# Patient Record
Sex: Male | Born: 2005 | Hispanic: Yes | Marital: Single | Smoking: Never smoker
Health system: Southern US, Community
[De-identification: ages and names within clinical notes are randomized; demographics above are authoritative.]

---
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
## 2023-06-09 ENCOUNTER — Ambulatory Visit
Admission: EM | Admit: 2023-06-09 | Discharge: 2023-06-09 | Disposition: A | Discharge status: Home / Self Care | Payer: Medicaid Other | Attending: Emergency Medicine | Admitting: Emergency Medicine

## 2023-06-09 DIAGNOSIS — J029 Acute pharyngitis, unspecified: Secondary | ICD-10-CM | POA: Diagnosis present

## 2023-06-09 DIAGNOSIS — B349 Viral infection, unspecified: Secondary | ICD-10-CM | POA: Diagnosis present

## 2023-06-09 LAB — GROUP A STREP BY PCR: Group A Strep by PCR: NOT DETECTED
